# Patient Record
Sex: Male | Born: 1995 | Race: White | Hispanic: No | Marital: Single | Smoking: Current some day smoker
Health system: Southern US, Community
[De-identification: ages and names within clinical notes are randomized; demographics above are authoritative.]

## PROBLEM LIST (undated history)

## (undated) DIAGNOSIS — R51 Headache: Secondary | ICD-10-CM

## (undated) DIAGNOSIS — R519 Headache, unspecified: Secondary | ICD-10-CM

---
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
## 2017-12-11 ENCOUNTER — Encounter: Payer: Self-pay | Admitting: Family Medicine

## 2017-12-11 ENCOUNTER — Ambulatory Visit: Payer: BLUE CROSS/BLUE SHIELD | Admitting: Family Medicine

## 2017-12-11 VITALS — BP 120/70 | HR 65 | Temp 98.2°F | Resp 16 | Wt 137.0 lb

## 2017-12-11 DIAGNOSIS — R197 Diarrhea, unspecified: Secondary | ICD-10-CM | POA: Diagnosis not present

## 2017-12-11 DIAGNOSIS — R1013 Epigastric pain: Secondary | ICD-10-CM

## 2017-12-11 DIAGNOSIS — R634 Abnormal weight loss: Secondary | ICD-10-CM | POA: Diagnosis not present

## 2017-12-11 NOTE — Progress Notes (Signed)
Patient: Clarence Silva Male    DOB: 02-02-1996   22 y.o.   MRN: [id number] Visit Date: 12/11/2017  Today's Provider: Mila Merry, MD   Chief Complaint  Patient presents with  . Diarrhea   Subjective:    Patient has had diarrhea and loss of appetite for around 9 months. Patient has lost over 30 lbs in the last few months. Patient states is has intermittent diarrhea after he eats and occasionally first thing in the mornings. Patient states also states he has an intermittent pain in upper abdomen. Patient also has occasional symptoms of headaches, sweats, chills, and nausea. Patient states he has taken antacids occasionally with mild relief.   Diarrhea   This is a new problem. The current episode started more than 1 month ago (9 months). The problem has been unchanged. The stool consistency is described as watery. The patient states that diarrhea does not awaken him from sleep. Associated symptoms include abdominal pain, chills, headaches, increased flatus, sweats and weight loss. Pertinent negatives include no arthralgias, bloating, coughing, fever, myalgias, URI or vomiting. The symptoms are aggravated by dairy products and rye/wheat. There are no known risk factors. Treatments tried: antacid  The treatment provided mild relief.   Wt Readings from Last 3 Encounters:  12/11/17 137 lb (62.1 kg) (20 %, Z= -0.84)*  08/23/16 170 lb (77.1 kg) (75 %, Z= 0.69)*  07/24/16 174 lb 12.8 oz (79.3 kg) (80 %, Z= 0.85)*    States he takes Nexium daily which helps pain in stomach. Only one episode of blood  Diarrhea occurs about once a week, but bowels are normal on other days. No melena. No mucous stools.   Denies feeling anxious, but has a little trouble sleeping. Feels heart racing a few times a week.       No Known Allergies   Current Outpatient Medications:  .  esomeprazole (NEXIUM) 10 MG packet, Take 10 mg by mouth daily before breakfast., Disp: , Rfl:   Review of Systems    Constitutional: Positive for appetite change, chills, unexpected weight change and weight loss. Negative for fever.  Respiratory: Negative for cough, chest tightness, shortness of breath and wheezing.   Cardiovascular: Negative for chest pain and palpitations.  Gastrointestinal: Positive for abdominal pain, diarrhea, flatus and nausea. Negative for bloating and vomiting.  Musculoskeletal: Negative for arthralgias and myalgias.  Neurological: Positive for headaches.    Social History   Tobacco Use  . Smoking status: Current Some Day Smoker    Years: 1.00    Types: E-cigarettes  . Smokeless tobacco: Never Used  Substance Use Topics  . Alcohol use: No    Alcohol/week: 0.0 oz   Objective:   BP 120/70 (BP Location: Right Arm, Patient Position: Sitting, Cuff Size: Normal)   Pulse 65   Temp 98.2 F (36.8 C) (Oral)   Resp 16   Wt 137 lb (62.1 kg)   SpO2 99%   BMI 20.53 kg/m  Vitals:   12/11/17 1111  BP: 120/70  Pulse: 65  Resp: 16  Temp: 98.2 F (36.8 C)  TempSrc: Oral  SpO2: 99%  Weight: 137 lb (62.1 kg)     Physical Exam  General Appearance:    Alert, cooperative, no distress  Eyes:    PERRL, conjunctiva/corneas clear, EOM's intact       Lungs:     Clear to auscultation bilaterally, respirations unlabored  Heart:    Regular rate and rhythm  Abdomen:  bowel sounds present and normal in all 4 quadrants, soft, round or nondistended. Mild epigastric tenderness.         Assessment & Plan:     1. Diarrhea, unspecified type  - CBC - Comprehensive metabolic panel - Celiac Disease Ab Screen w/Rfx - TSH - T4, free - H. pylori breath test - Amylase  2. Epigastric pain  - CBC - Comprehensive metabolic panel - Celiac Disease Ab Screen w/Rfx - TSH - T4, free - H. pylori breath test - Amylase  3. Abnormal loss of weight  Refer GI if labs normal.        Mila Merry, MD  [facility]

## 2017-12-11 NOTE — Patient Instructions (Signed)
   Please go to the lab draw center in [address] on the second floor of [facility]  

## 2017-12-12 LAB — H. PYLORI BREATH TEST: H pylori Breath Test: NEGATIVE

## 2017-12-13 ENCOUNTER — Telehealth: Payer: Self-pay

## 2017-12-13 DIAGNOSIS — R634 Abnormal weight loss: Secondary | ICD-10-CM

## 2017-12-13 DIAGNOSIS — G8929 Other chronic pain: Secondary | ICD-10-CM

## 2017-12-13 DIAGNOSIS — R109 Unspecified abdominal pain: Secondary | ICD-10-CM

## 2017-12-13 NOTE — Telephone Encounter (Signed)
Left message to call back. Orders has not been placed for referral yet.

## 2017-12-13 NOTE — Telephone Encounter (Signed)
-----   Message from Malva Limes, MD sent at 12/13/2017  7:51 AM EDT ----- Labs all normal. Need abdominal ultrasound and referral to gi for chronic abdominal pain and weight loss.

## 2017-12-13 NOTE — Telephone Encounter (Signed)
Advised patient of results. Orders have been placed.

## 2017-12-14 LAB — COMPREHENSIVE METABOLIC PANEL
ALBUMIN: 4.6 g/dL (ref 3.5–5.5)
ALT: 19 IU/L (ref 0–44)
AST: 22 IU/L (ref 0–40)
Albumin/Globulin Ratio: 2.4 — ABNORMAL HIGH (ref 1.2–2.2)
Alkaline Phosphatase: 94 IU/L (ref 39–117)
BILIRUBIN TOTAL: 0.8 mg/dL (ref 0.0–1.2)
BUN / CREAT RATIO: 14 (ref 9–20)
BUN: 13 mg/dL (ref 6–20)
CHLORIDE: 103 mmol/L (ref 96–106)
CO2: 23 mmol/L (ref 20–29)
Calcium: 9.8 mg/dL (ref 8.7–10.2)
Creatinine, Ser: 0.93 mg/dL (ref 0.76–1.27)
GFR calc Af Amer: 137 mL/min/{1.73_m2} (ref >59)
GFR calc non Af Amer: 119 mL/min/{1.73_m2} (ref >59)
GLOBULIN, TOTAL: 1.9 g/dL (ref 1.5–4.5)
GLUCOSE: 74 mg/dL (ref 65–99)
Potassium: 4.5 mmol/L (ref 3.5–5.2)
SODIUM: 141 mmol/L (ref 134–144)
TOTAL PROTEIN: 6.5 g/dL (ref 6.0–8.5)

## 2017-12-14 LAB — CBC
HEMATOCRIT: 42.9 % (ref 37.5–51.0)
HEMOGLOBIN: 14.9 g/dL (ref 13.0–17.7)
MCH: 31.6 pg (ref 26.6–33.0)
MCHC: 34.7 g/dL (ref 31.5–35.7)
MCV: 91 fL (ref 79–97)
Platelets: 176 10*3/uL (ref 150–379)
RBC: 4.71 x10E6/uL (ref 4.14–5.80)
RDW: 13.4 % (ref 12.3–15.4)
WBC: 3.9 10*3/uL (ref 3.4–10.8)

## 2017-12-14 LAB — T4, FREE: Free T4: 1.52 ng/dL (ref 0.93–1.60)

## 2017-12-14 LAB — CELIAC DISEASE AB SCREEN W/RFX
Antigliadin Abs, IgA: 3 units (ref 0–19)
IgA/Immunoglobulin A, Serum: 180 mg/dL (ref 90–386)
Transglutaminase IgA: <2 U/mL (ref 0–3)

## 2017-12-14 LAB — AMYLASE: AMYLASE: 50 U/L (ref 31–124)

## 2017-12-14 LAB — TSH: TSH: 1.14 u[IU]/mL (ref 0.450–4.500)

## 2017-12-20 ENCOUNTER — Ambulatory Visit: Payer: Self-pay

## 2017-12-25 ENCOUNTER — Ambulatory Visit
Admission: RE | Admit: 2017-12-25 | Discharge: 2017-12-25 | Disposition: A | Discharge status: Home / Self Care | Payer: BLUE CROSS/BLUE SHIELD | Source: Ambulatory Visit | Attending: Family Medicine | Admitting: Family Medicine

## 2017-12-25 DIAGNOSIS — R161 Splenomegaly, not elsewhere classified: Secondary | ICD-10-CM | POA: Diagnosis not present

## 2017-12-25 DIAGNOSIS — R109 Unspecified abdominal pain: Secondary | ICD-10-CM | POA: Insufficient documentation

## 2017-12-25 DIAGNOSIS — G8929 Other chronic pain: Secondary | ICD-10-CM

## 2017-12-25 DIAGNOSIS — R634 Abnormal weight loss: Secondary | ICD-10-CM | POA: Diagnosis present

## 2018-01-28 ENCOUNTER — Other Ambulatory Visit: Payer: Self-pay

## 2018-01-28 ENCOUNTER — Encounter: Payer: Self-pay | Admitting: Gastroenterology

## 2018-01-28 ENCOUNTER — Ambulatory Visit: Payer: BLUE CROSS/BLUE SHIELD | Admitting: Gastroenterology

## 2018-01-28 VITALS — BP 123/77 | HR 83 | Resp 17 | Wt 140.4 lb

## 2018-01-28 DIAGNOSIS — R1013 Epigastric pain: Secondary | ICD-10-CM

## 2018-01-28 DIAGNOSIS — R634 Abnormal weight loss: Secondary | ICD-10-CM | POA: Insufficient documentation

## 2018-01-28 DIAGNOSIS — R61 Generalized hyperhidrosis: Secondary | ICD-10-CM

## 2018-01-28 DIAGNOSIS — F411 Generalized anxiety disorder: Secondary | ICD-10-CM | POA: Diagnosis not present

## 2018-01-28 DIAGNOSIS — R079 Chest pain, unspecified: Secondary | ICD-10-CM

## 2018-01-28 DIAGNOSIS — G8929 Other chronic pain: Secondary | ICD-10-CM

## 2018-01-28 MED ORDER — AMITRIPTYLINE HCL 25 MG PO TABS: 25.0000 mg | ORAL_TABLET | Freq: Every day | ORAL | 0 refills | Status: AC

## 2018-01-28 NOTE — Progress Notes (Signed)
Arlyss Repress, MD [address]  [address]  [place], [place] [address]  Main: [phone]  Fax: [phone]    Gastroenterology Consultation  Referring Provider:     Malva Limes, MD Primary Care Physician:  Anola Gurney, PA Primary Gastroenterologist:  Dr. Arlyss Repress Reason for Consultation:   unintentional weight loss, night sweats, abdominal pain        HPI:   DEZMEN ALCOCK is a 22 y.o. Caucasian male with no past medical history referred by Dr. Anola Gurney, PA  for consultation & management of approximately six-month history of burning pain in the chest associated with regurgitation as well as bilateral upper abdominal pain, sharp in severity, sporadic sometimes worse after eating. Patient reports about 25 pounds weight loss unintentionally. 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 His current job is very hectic and hasn't been eating 3 meals per day regularly. He also reports early satiety,night sweats and he thinks the night sweats are mostly due to nightmares that has been experiencing. He denies diarrhea or constipation or rectal bleeding. He had CBC, CMP, H. Pylori breath test, TTG IgA which were all unremarkable, performed in 12/2017 by Dr. Sherrie Mustache. He was told that these symptoms are probably related to stress patient acknowledges that he is generally stressful person and also anxious He had an ultrasound abdomen which was unremarkable He is currently on Nexium daily which provides partial relief of his symptoms  He denies recent travel, drug abuse, sick contacts  NSAIDs: none  Antiplts/Anticoagulants/Anti thrombotics: none   GI Procedures: none He denies abdominal surgeries, denies family history of medical problems  No past medical history on file.  Past Surgical History:  Procedure Laterality Date  . ANKLE SURGERY Left 12/11/2013    Prior to Admission medications     Medication Sig Start Date End Date Taking? Authorizing Provider  esomeprazole (NEXIUM) 10 MG packet Take 10 mg by mouth daily before breakfast.    [provider]    Family History  Problem Relation Age of Onset  . Healthy Mother   . Healthy Father   . Healthy Sister   . Healthy Brother   . Heart failure Maternal Grandfather   . Diverticulitis Paternal Grandmother   . Heart failure Paternal Grandfather   . Aneurysm Paternal Grandfather   . Healthy Brother   . Diabetes Maternal Grandmother   . Hypertension Maternal Grandmother   . Heart disease Maternal Grandmother      Social History   Tobacco Use  . Smoking status: Current Some Day Smoker    Years: 1.00    Types: E-cigarettes  . Smokeless tobacco: Never Used  Substance Use Topics  . Alcohol use: No    Alcohol/week: 0.0 oz  . Drug use: No    Allergies as of 01/28/2018  . (No Known Allergies)    Review of Systems:    All systems reviewed and negative except where noted in HPI.   Physical Exam:  BP 123/77 (BP Location: Left Arm, Patient Position: Sitting, Cuff Size: Normal)   Pulse 83   Resp 17   Wt 140 lb 6.4 oz (63.7 kg)   BMI 21.04 kg/m  No LMP for male patient.  General:   Alert,  Thin built, moderately nourished, pleasant and cooperative in NAD Head:  Normocephalic and atraumatic. Eyes:  Sclera clear, no icterus.   Conjunctiva pink. Ears:  Normal auditory  acuity. Nose:  No deformity, discharge, or lesions. Mouth:  No deformity or lesions,oropharynx pink & moist. Neck:  Supple; no masses or thyromegaly. Lungs:  Respirations even and unlabored.  Clear throughout to auscultation.   No wheezes, crackles, or rhonchi. No acute distress. Heart:  Regular rate and rhythm; no murmurs, clicks, rubs, or gallops. Abdomen:  Normal bowel sounds. Soft, non-tender and non-distended without masses, hepatosplenomegaly or hernias noted.  No guarding or rebound tenderness.   Rectal: Not performed Msk:  Symmetrical  without gross deformities. Good, equal movement & strength bilaterally. Pulses:  Normal pulses noted. Extremities:  No clubbing or edema.  No cyanosis. Neurologic:  Alert and oriented x3;  grossly normal neurologically. Skin:  Intact without significant lesions or rashes. No jaundice. Lymph Nodes:  No significant cervical adenopathy. Psych:  Alert and cooperative. Normal mood and affect.  Imaging Studies: reviewed  Assessment and Plan:   Orvis BrillJoshua C Pallas is a 22 y.o. Caucasian male with no significant past medical history, presents with approximately six-month history of unintentional weight loss, unexplained night sweats associated with burning in the chest,intermittent bilateral upper abdominal sharp pain. H. Pylori breath test negative but this was performed while he is on PPI. TTG IgA negative  - Recommend EGD for further evaluation with biopsies - Recommend CT chest, abdomen and pelvis with contrast to rule out lymphoma or other malignancy - HIV, QuantiFERON gold - Start low-dose amitriptyline 25 mg at bedtime   Follow up in 4 weeks   Arlyss Repressohini R Dimetri Armitage, MD

## 2018-02-03 ENCOUNTER — Ambulatory Visit
Admission: RE | Admit: 2018-02-03 | Discharge: 2018-02-03 | Disposition: A | Discharge status: Home / Self Care | Payer: BLUE CROSS/BLUE SHIELD | Source: Ambulatory Visit | Attending: Gastroenterology | Admitting: Gastroenterology

## 2018-02-03 ENCOUNTER — Encounter: Payer: Self-pay | Admitting: *Deleted

## 2018-02-03 ENCOUNTER — Encounter
Admission: RE | Disposition: A | Discharge status: Home / Self Care | Payer: Self-pay | Source: Ambulatory Visit | Attending: Gastroenterology

## 2018-02-03 ENCOUNTER — Ambulatory Visit: Payer: BLUE CROSS/BLUE SHIELD | Admitting: Anesthesiology

## 2018-02-03 DIAGNOSIS — F17210 Nicotine dependence, cigarettes, uncomplicated: Secondary | ICD-10-CM | POA: Diagnosis not present

## 2018-02-03 DIAGNOSIS — R1010 Upper abdominal pain, unspecified: Secondary | ICD-10-CM | POA: Diagnosis present

## 2018-02-03 DIAGNOSIS — K2950 Unspecified chronic gastritis without bleeding: Secondary | ICD-10-CM | POA: Diagnosis not present

## 2018-02-03 DIAGNOSIS — R61 Generalized hyperhidrosis: Secondary | ICD-10-CM

## 2018-02-03 DIAGNOSIS — R634 Abnormal weight loss: Secondary | ICD-10-CM | POA: Diagnosis not present

## 2018-02-03 HISTORY — DX: Headache, unspecified: R51.9

## 2018-02-03 HISTORY — PX: ESOPHAGOGASTRODUODENOSCOPY (EGD) WITH PROPOFOL: SHX5813

## 2018-02-03 HISTORY — DX: Headache: R51

## 2018-02-03 SURGERY — ESOPHAGOGASTRODUODENOSCOPY (EGD) WITH PROPOFOL
Anesthesia: General

## 2018-02-03 MED ORDER — SODIUM CHLORIDE 0.9 % IV SOLN
INTRAVENOUS | Status: DC
  Administered 2018-02-03: 1000 mL via INTRAVENOUS

## 2018-02-03 MED ORDER — LIDOCAINE HCL (CARDIAC) PF 100 MG/5ML IV SOSY
PREFILLED_SYRINGE | INTRAVENOUS | Status: DC | PRN
  Administered 2018-02-03: 80 mg via INTRAVENOUS

## 2018-02-03 MED ORDER — PROPOFOL 10 MG/ML IV BOLUS
INTRAVENOUS | Status: DC | PRN
  Administered 2018-02-03: 110 mg via INTRAVENOUS
  Administered 2018-02-03: 60 mg via INTRAVENOUS

## 2018-02-03 MED ORDER — FENTANYL CITRATE (PF) 100 MCG/2ML IJ SOLN
INTRAMUSCULAR | Status: DC | PRN
  Administered 2018-02-03: 25 ug via INTRAVENOUS
  Administered 2018-02-03: 50 ug via INTRAVENOUS
  Administered 2018-02-03: 25 ug via INTRAVENOUS

## 2018-02-03 MED ORDER — FENTANYL CITRATE (PF) 100 MCG/2ML IJ SOLN
INTRAMUSCULAR | Status: AC
  Filled 2018-02-03: qty 2

## 2018-02-03 NOTE — Transfer of Care (Signed)
Immediate Anesthesia Transfer of Care Note  Patient: Clarence BrillJoshua C Silva  Procedure(s) Performed: ESOPHAGOGASTRODUODENOSCOPY (EGD) WITH PROPOFOL (N/A )  Patient Location: PACU  Anesthesia Type:General  Level of Consciousness: awake, alert  and oriented  Airway & Oxygen Therapy: Patient Spontanous Breathing  Post-op Assessment: Report given to RN  Post vital signs: Reviewed and stable  Last Vitals:  Vitals Value Taken Time  BP    Temp    Pulse 71 02/03/2018 11:40 AM  Resp 12 02/03/2018 11:40 AM  SpO2 99 % 02/03/2018 11:40 AM  Vitals shown include unvalidated device data.  Last Pain:  Vitals:   02/03/18 1009  TempSrc: Tympanic         Complications: No apparent anesthesia complications

## 2018-02-03 NOTE — Anesthesia Post-op Follow-up Note (Signed)
Anesthesia QCDR form completed.        

## 2018-02-03 NOTE — Op Note (Signed)
[facility] Gastroenterology Patient Name: Clarence Silva Procedure Date: 02/03/2018 11:19 AM MRN: [id number] Account #: [id number] Date of Birth: 1995-09-01 Admit Type: Outpatient Age: 22 Room: [facility] ENDO ROOM 2 Gender: Male Note Status: Finalized Procedure:            Upper GI endoscopy Indications:          Upper abdominal pain, Weight loss Providers:            Lin Landsman MD, MD Referring MD:         No Local Md, MD (Referring MD) Medicines:            Monitored Anesthesia Care Complications:        No immediate complications. Estimated blood loss: None. Procedure:            Pre-Anesthesia Assessment:                       - Prior to the procedure, a History and Physical was                        performed, and patient medications and allergies were                        reviewed. The patient is competent. The risks and                        benefits of the procedure and the sedation options and                        risks were discussed with the patient. All questions                        were answered and informed consent was obtained.                        Patient identification and proposed procedure were                        verified by the physician, the nurse, the                        anesthesiologist, the anesthetist and the technician in                        the pre-procedure area in the procedure room in the                        endoscopy suite. Mental Status Examination: alert and                        oriented. Airway Examination: normal oropharyngeal                        airway and neck mobility. Respiratory Examination:                        clear to auscultation. CV Examination: normal.                        Prophylactic Antibiotics: The patient does not require  prophylactic antibiotics. Prior Anticoagulants: The                        patient has taken no previous anticoagulant or           antiplatelet agents. ASA Grade Assessment: II - A                        patient with mild systemic disease. After reviewing the                        risks and benefits, the patient was deemed in                        satisfactory condition to undergo the procedure. The                        anesthesia plan was to use monitored anesthesia care                        (MAC). Immediately prior to administration of                        medications, the patient was re-assessed for adequacy                        to receive sedatives. The heart rate, respiratory rate,                        oxygen saturations, blood pressure, adequacy of                        pulmonary ventilation, and response to care were                        monitored throughout the procedure. The physical status                        of the patient was re-assessed after the procedure.                       After obtaining informed consent, the endoscope was                        passed under direct vision. Throughout the procedure,                        the patient's blood pressure, pulse, and oxygen                        saturations were monitored continuously. The Endoscope                        was introduced through the mouth, and advanced to the                        second part of duodenum. The Endoscope was introduced                        through the mouth, and advanced to the second part of  duodenum. The upper GI endoscopy was accomplished                        without difficulty. The patient tolerated the procedure                        well. Findings:      The duodenal bulb and second portion of the duodenum were normal.       Biopsies for histology were taken with a cold forceps for evaluation of       celiac disease.      The entire examined stomach was normal. Biopsies were taken with a cold       forceps for Helicobacter pylori testing.      The cardia and  gastric fundus were normal on retroflexion.      The gastroesophageal junction and examined esophagus were normal. Impression:           - Normal duodenal bulb and second portion of the                        duodenum. Biopsied.                       - Normal stomach. Biopsied.                       - Normal gastroesophageal junction and esophagus. Recommendation:       - Discharge patient to home (with escort).                       - Resume previous diet today.                       - Continue present medications.                       - Await pathology results.                       - Return to GI clinic as previously scheduled. Procedure Code(s):    --- Professional ---                       [id number], Esophagogastroduodenoscopy, flexible, transoral;                        with biopsy, single or multiple Diagnosis Code(s):    --- Professional ---                       R10.10, Upper abdominal pain, unspecified                       R63.4, Abnormal weight loss CPT copyright 2017 [employer]. All rights reserved. The codes documented in this report are preliminary and upon coder review may  be revised to meet current compliance requirements. Dr. Ulyess Mort Lin Landsman MD, MD 02/03/2018 11:37:01 AM This report has been signed electronically. Number of Addenda: 0 Note Initiated On: 02/03/2018 11:19 AM      [facility]

## 2018-02-03 NOTE — Anesthesia Postprocedure Evaluation (Signed)
Anesthesia Post Note  Patient: Clarence Silva  Procedure(s) Performed: ESOPHAGOGASTRODUODENOSCOPY (EGD) WITH PROPOFOL (N/A )  Patient location during evaluation: Endoscopy Anesthesia Type: General Level of consciousness: awake and alert Pain management: pain level controlled Vital Signs Assessment: post-procedure vital signs reviewed and stable Respiratory status: spontaneous breathing, nonlabored ventilation, respiratory function stable and patient connected to nasal cannula oxygen Cardiovascular status: blood pressure returned to baseline and stable Postop Assessment: no apparent nausea or vomiting Anesthetic complications: no     Last Vitals:  Vitals:   02/03/18 1200 02/03/18 1210  BP:  122/70  Pulse: (!) 56 (!) 46  Resp: 16 15  Temp:    SpO2: 99% 98%    Last Pain:  Vitals:   02/03/18 1210  TempSrc:   PainSc: 0-No pain                 Navin Dogan S

## 2018-02-03 NOTE — Anesthesia Preprocedure Evaluation (Signed)
Anesthesia Evaluation  Patient identified by MRN, date of birth, ID band Patient awake    Reviewed: Allergy & Precautions, NPO status , Patient's Chart, lab work & pertinent test results, reviewed documented beta blocker date and time   Airway Mallampati: II  TM Distance: >3 FB     Dental  (+) Chipped   Pulmonary Current Smoker,           Cardiovascular      Neuro/Psych  Headaches,    GI/Hepatic   Endo/Other    Renal/GU      Musculoskeletal   Abdominal   Peds  Hematology   Anesthesia Other Findings   Reproductive/Obstetrics                             Anesthesia Physical Anesthesia Plan  ASA: II  Anesthesia Plan: General   Post-op Pain Management:    Induction: Intravenous  PONV Risk Score and Plan:   Airway Management Planned:   Additional Equipment:   Intra-op Plan:   Post-operative Plan:   Informed Consent: I have reviewed the patients History and Physical, chart, labs and discussed the procedure including the risks, benefits and alternatives for the proposed anesthesia with the patient or authorized representative who has indicated his/her understanding and acceptance.     Plan Discussed with: CRNA  Anesthesia Plan Comments:         Anesthesia Quick Evaluation

## 2018-02-03 NOTE — H&P (Signed)
Arlyss Repress, MD [address]  [address]  [place], [place] [address]  Main: [phone]  Fax: [phone] Pager: [phone]  Primary Care Physician:  Anola Gurney, [place] Primary Gastroenterologist:  Dr. Arlyss Repress  Pre-Procedure History & Physical: HPI:  Clarence Silva is a 22 y.o. male is here for an endoscopy.   Past Medical History:  Diagnosis Date  . Headache     Past Surgical History:  Procedure Laterality Date  . ANKLE SURGERY Left 12/11/2013    Prior to Admission medications   Medication Sig Start Date End Date Taking? Authorizing Provider  amitriptyline (ELAVIL) 25 MG tablet Take 1 tablet (25 mg total) by mouth at bedtime. 01/28/18 02/27/18  Toney Reil, MD  esomeprazole (NEXIUM) 10 MG packet Take 10 mg by mouth daily before breakfast.    [provider]    Allergies as of 01/28/2018  . (No Known Allergies)    Family History  Problem Relation Age of Onset  . Healthy Mother   . Healthy Father   . Healthy Sister   . Healthy Brother   . Heart failure Maternal Grandfather   . Diverticulitis Paternal Grandmother   . Heart failure Paternal Grandfather   . Aneurysm Paternal Grandfather   . Healthy Brother   . Diabetes Maternal Grandmother   . Hypertension Maternal Grandmother   . Heart disease Maternal Grandmother     Social History   Socioeconomic History  . Marital status: Single    Spouse name: Not on file  . Number of children: Not on file  . Years of education: Not on file  . Highest education level: Not on file  Occupational History  . Not on file  Social Needs  . Financial resource strain: Not on file  . Food insecurity:    Worry: Not on file    Inability: Not on file  . Transportation needs:    Medical: Not on file    Non-medical: Not on file  Tobacco Use  . Smoking status: Current Some Day Smoker    Years: 1.00    Types: E-cigarettes  . Smokeless tobacco: Never Used  Substance and Sexual Activity  .  Alcohol use: No    Alcohol/week: 0.0 oz  . Drug use: No  . Sexual activity: Not on file  Lifestyle  . Physical activity:    Days per week: Not on file    Minutes per session: Not on file  . Stress: Not on file  Relationships  . Social connections:    Talks on phone: Not on file    Gets together: Not on file    Attends religious service: Not on file    Active member of club or organization: Not on file    Attends meetings of clubs or organizations: Not on file    Relationship status: Not on file  . Intimate partner violence:    Fear of current or ex partner: Not on file    Emotionally abused: Not on file    Physically abused: Not on file    Forced sexual activity: Not on file  Other Topics Concern  . Not on file  Social History Narrative  . Not on file    Review of Systems: See HPI, otherwise negative ROS  Physical Exam: BP 122/79   Pulse 72   Temp (!) 97.2 F (36.2 C) (Tympanic)   Resp 18   Ht 5\' 11"  (1.803 m)   Wt 140 lb (63.5 kg)  SpO2 100%   BMI 19.53 kg/m  General:   Alert,  pleasant and cooperative in NAD Head:  Normocephalic and atraumatic. Neck:  Supple; no masses or thyromegaly. Lungs:  Clear throughout to auscultation.    Heart:  Regular rate and rhythm. Abdomen:  Soft, nontender and nondistended. Normal bowel sounds, without guarding, and without rebound.   Neurologic:  Alert and  oriented x4;  grossly normal neurologically.  Impression/Plan: Clarence Silva is here for an endoscopy to be performed for upper abdominal pain, weight loss  Risks, benefits, limitations, and alternatives regarding  endoscopy have been reviewed with the patient.  Questions have been answered.  All parties agreeable.   Lannette Donathohini Arnoldo Hildreth, MD  02/03/2018, 11:48 AM

## 2018-02-04 LAB — HIV ANTIBODY (ROUTINE TESTING W REFLEX): HIV SCREEN 4TH GENERATION: NONREACTIVE

## 2018-02-04 LAB — SURGICAL PATHOLOGY

## 2018-02-05 ENCOUNTER — Ambulatory Visit: Payer: BLUE CROSS/BLUE SHIELD

## 2018-02-05 ENCOUNTER — Encounter: Payer: Self-pay | Admitting: Gastroenterology

## 2018-02-06 ENCOUNTER — Encounter: Payer: Self-pay | Admitting: Gastroenterology

## 2018-02-12 LAB — QUANTIFERON-TB GOLD PLUS (RQFGPL)
QUANTIFERON NIL VALUE: 0.03 [IU]/mL
QuantiFERON Mitogen Value: >10 IU/mL
QuantiFERON TB1 Ag Value: 0.04 IU/mL
QuantiFERON TB2 Ag Value: 0.05 IU/mL

## 2018-02-12 LAB — QUANTIFERON-TB GOLD PLUS: QUANTIFERON-TB GOLD PLUS: NEGATIVE

## 2018-02-18 ENCOUNTER — Ambulatory Visit: Admission: RE | Admit: 2018-02-18 | Payer: BLUE CROSS/BLUE SHIELD | Source: Ambulatory Visit

## 2018-03-12 ENCOUNTER — Ambulatory Visit: Payer: BLUE CROSS/BLUE SHIELD | Admitting: Gastroenterology

## 2018-04-18 ENCOUNTER — Ambulatory Visit: Payer: BLUE CROSS/BLUE SHIELD | Admitting: Gastroenterology

## 2018-11-02 IMAGING — US US ABDOMEN COMPLETE
1 series · 14 of 25 positions shown · non-contrast
Comparison: None.

CLINICAL DATA: Abdominal pain and weight loss over the past 3
months.

EXAM:
ABDOMEN ULTRASOUND COMPLETE

[Series 1: us abdomen complete · 0.20mm/px · 14 of 79 slices shown]
[im 1/79]
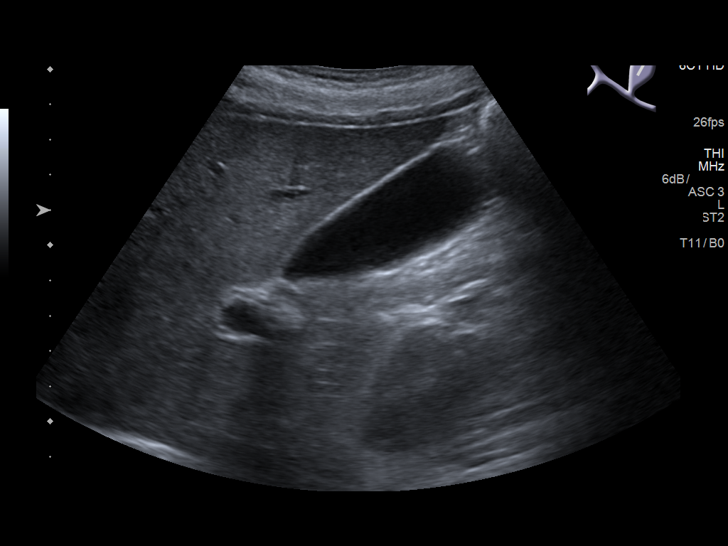
[im 7/79]
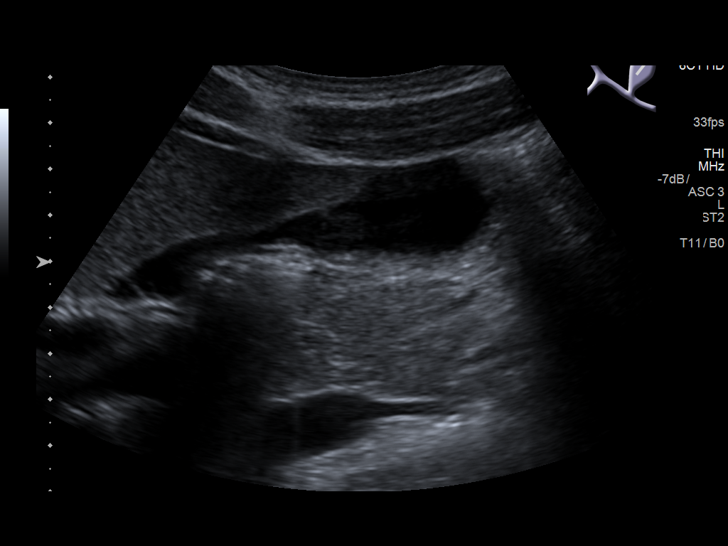
[im 14/79]
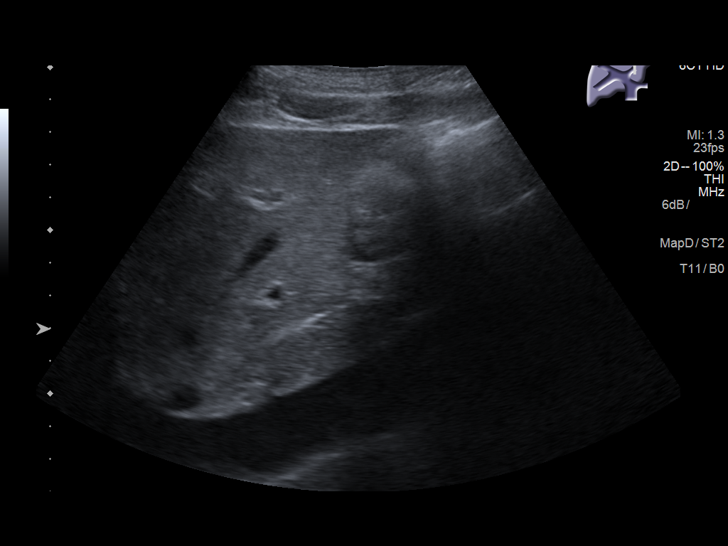
[im 20/79]
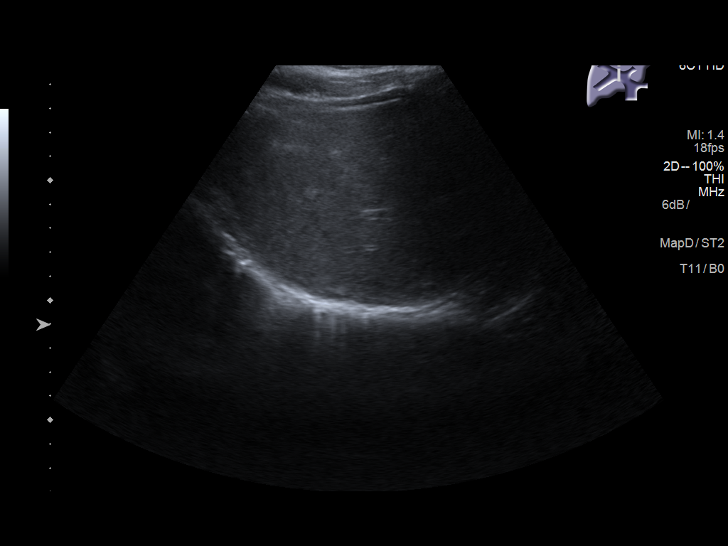
[im 27/79]
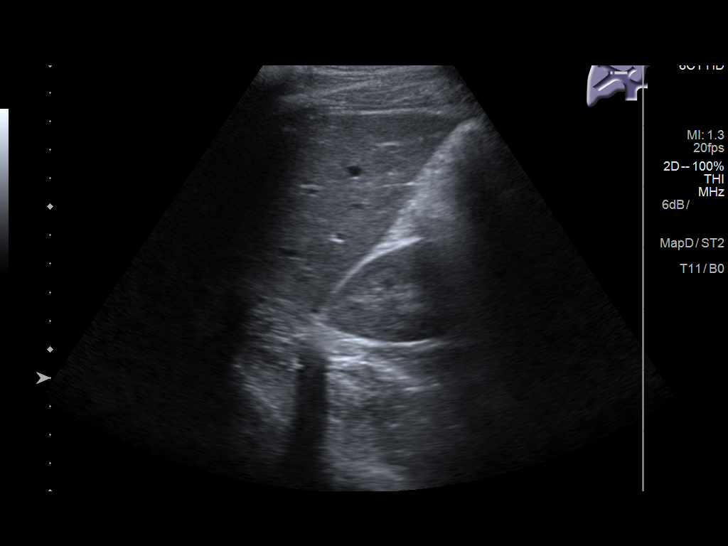
[im 30/79]
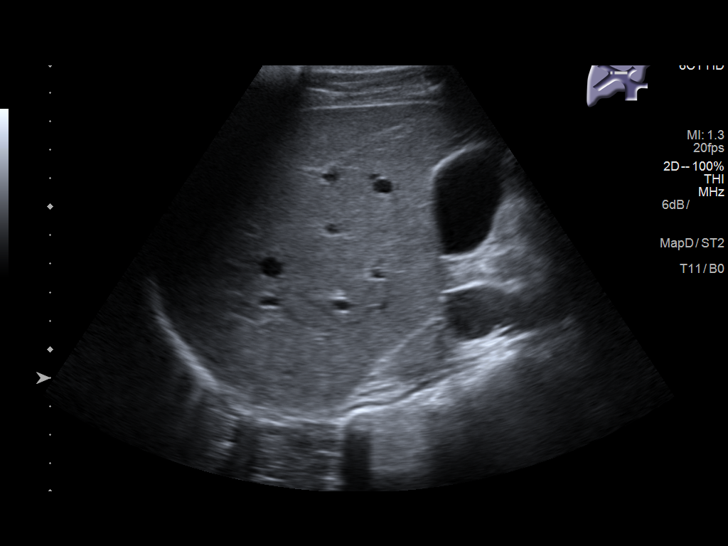
[im 36/79]
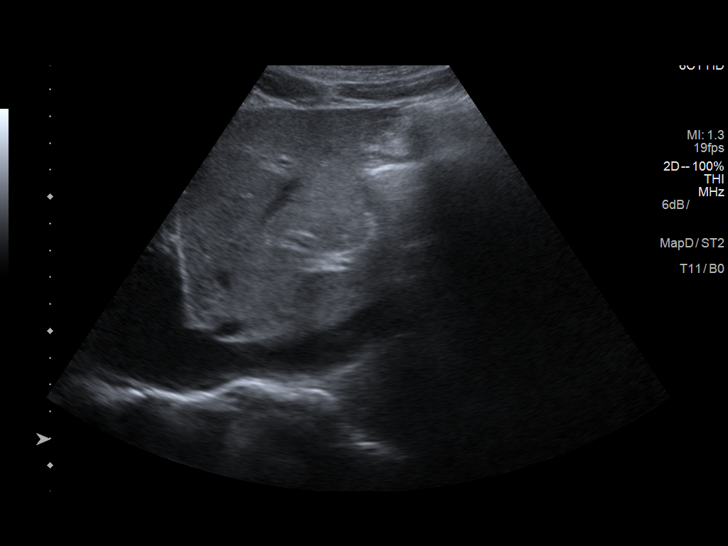
[im 43/79]
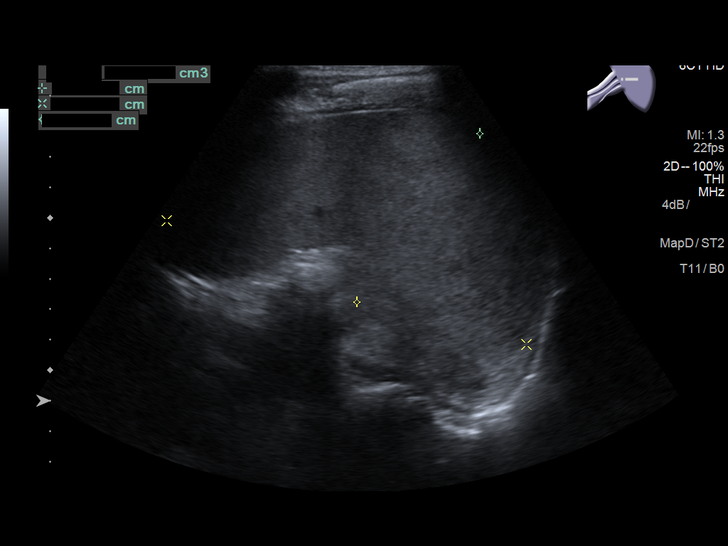
[im 49/79]
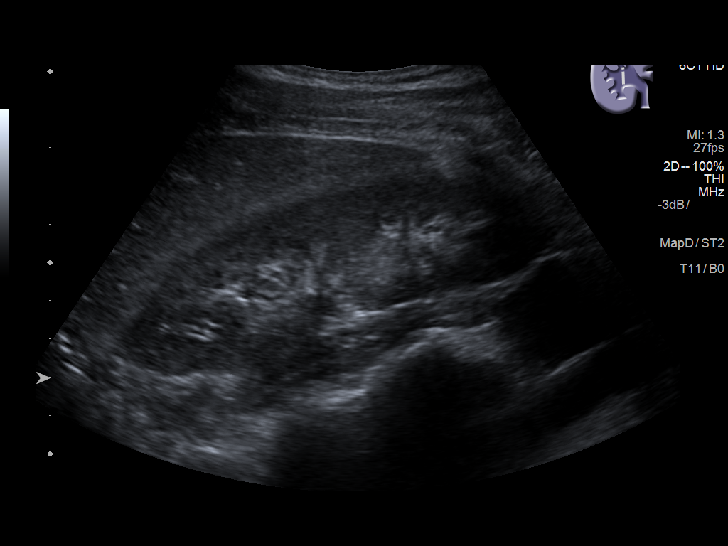
[im 53/79]
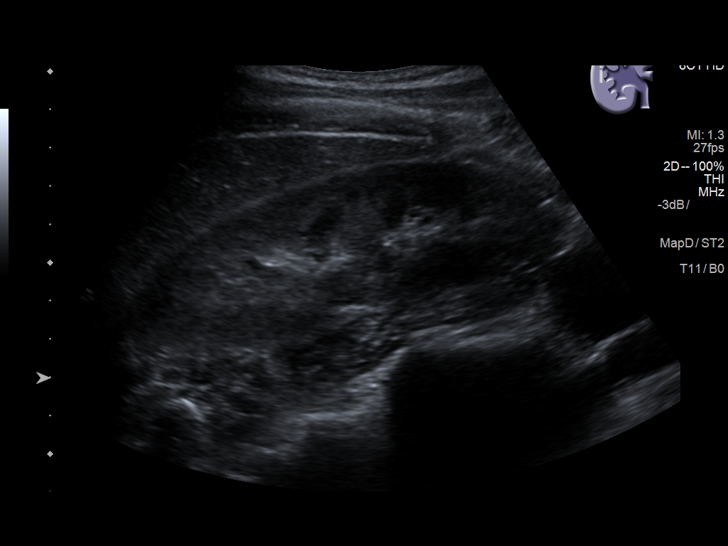
[im 59/79]
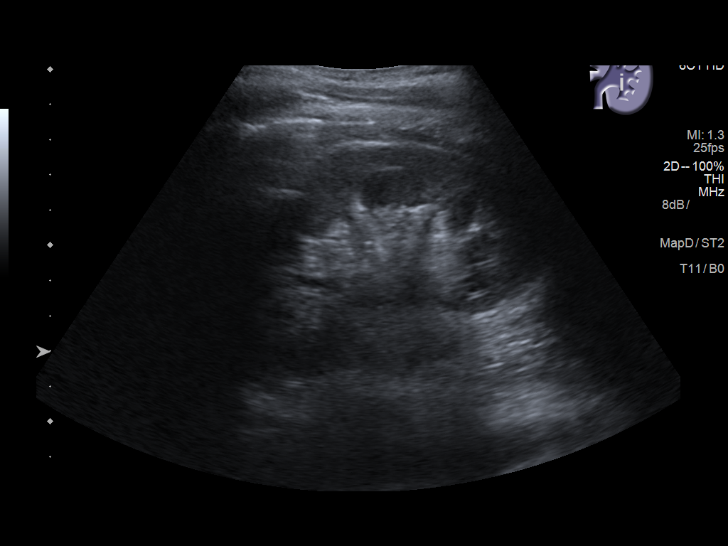
[im 66/79]
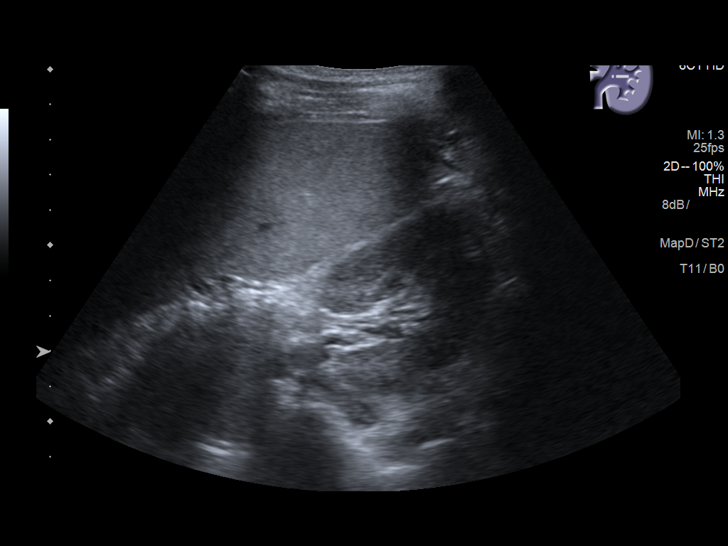
[im 72/79]
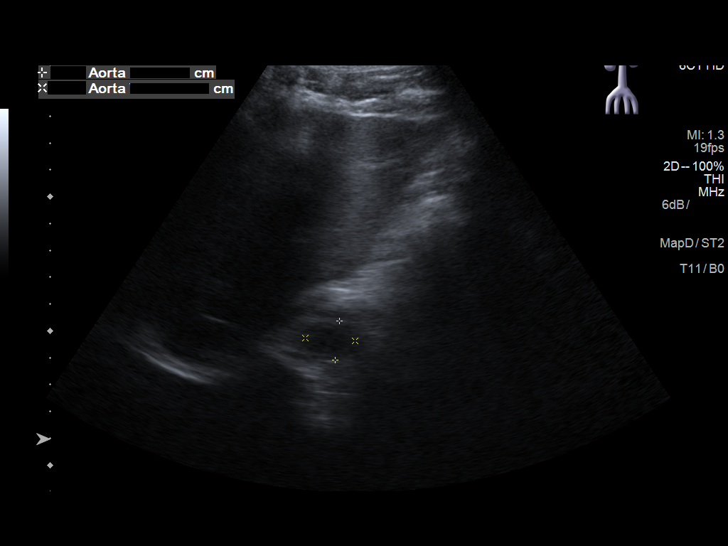
[im 79/79]
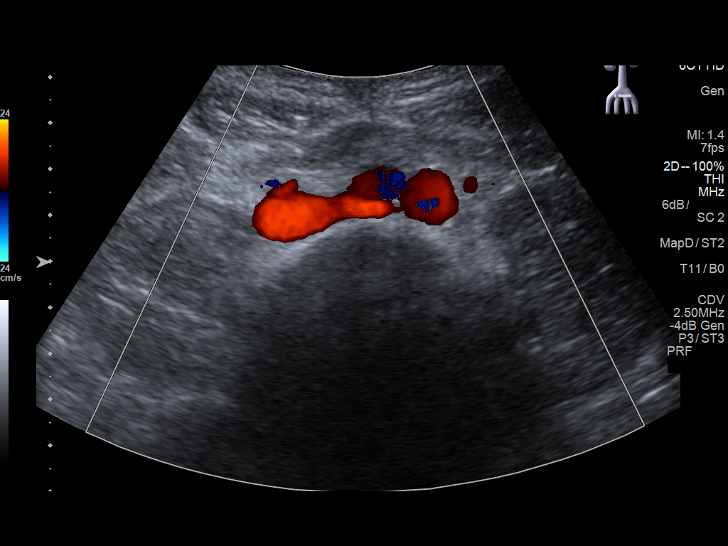

[14 of 25 positions shown; findings below may reference images not displayed]

FINDINGS: Gallbladder: No gallstones or wall thickening visualized. No
sonographic Murphy sign noted by sonographer.

Common bile duct: Diameter: 0.3 cm

Liver: No focal lesion identified. Within normal limits in
parenchymal echogenicity. Portal vein is patent on color Doppler
imaging with normal direction of blood flow towards the liver.

IVC: No abnormality visualized.

Pancreas: Visualized portion unremarkable.

Spleen: No focal lesion. Spleen volume is 536 cc, mildly enlarged.
Upper limit of normal is 411 cc.

Right Kidney: Length: 11.5 cm. Echogenicity within normal limits. No
mass or hydronephrosis visualized.

Left Kidney: Length: 10.5 cm.. Echogenicity within normal limits. No
mass or hydronephrosis visualized.

Abdominal aorta: No aneurysm visualized.

Other findings: None.
IMPRESSION: Mild splenomegaly.  The exam is otherwise negative.
